# Patient Record
Sex: Female | Born: 2005
Health system: Southern US, Community
[De-identification: ages and names within clinical notes are randomized; demographics above are authoritative.]

## PROBLEM LIST (undated history)

## (undated) ENCOUNTER — Ambulatory Visit: Admission: EM | Payer: BC Managed Care – PPO | Source: Home / Self Care

## (undated) DIAGNOSIS — G43909 Migraine, unspecified, not intractable, without status migrainosus: Secondary | ICD-10-CM

## (undated) DIAGNOSIS — R51 Headache: Secondary | ICD-10-CM

## (undated) HISTORY — DX: Headache: R51

## (undated) HISTORY — DX: Migraine, unspecified, not intractable, without status migrainosus: G43.909

---
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
## 2023-07-04 ENCOUNTER — Ambulatory Visit
Admission: EM | Admit: 2023-07-04 | Discharge: 2023-07-04 | Disposition: A | Discharge status: Home / Self Care | Payer: Managed Care, Other (non HMO) | Attending: Internal Medicine | Admitting: Internal Medicine

## 2023-07-04 ENCOUNTER — Other Ambulatory Visit: Payer: Self-pay

## 2023-07-04 ENCOUNTER — Encounter: Payer: Self-pay | Admitting: *Deleted

## 2023-07-04 DIAGNOSIS — J069 Acute upper respiratory infection, unspecified: Secondary | ICD-10-CM | POA: Diagnosis present

## 2023-07-04 DIAGNOSIS — U071 COVID-19: Secondary | ICD-10-CM | POA: Insufficient documentation

## 2023-07-04 DIAGNOSIS — J029 Acute pharyngitis, unspecified: Secondary | ICD-10-CM | POA: Diagnosis present

## 2023-07-04 LAB — POCT RAPID STREP A (OFFICE): Rapid Strep A Screen: NEGATIVE

## 2023-07-04 MED ORDER — PROMETHAZINE-DM 6.25-15 MG/5ML PO SYRP: 5.0000 mL | ORAL_SOLUTION | Freq: Four times a day (QID) | ORAL | 0 refills | Status: DC | PRN

## 2023-07-04 MED ORDER — FLUTICASONE PROPIONATE 50 MCG/ACT NA SUSP: 1.0000 | Freq: Every day | NASAL | 0 refills | Status: DC

## 2023-07-04 NOTE — ED Provider Notes (Addendum)
[name]    CSN: [id number] Arrival date & time: 07/04/23  1843      History   Chief Complaint Chief Complaint  Patient presents with   Sore Throat   Facial Pain    HPI Erin Gates is a 17 y.o. female.   Patient presents with 4-day history of sore throat, nasal congestion, cough.  Reports Tmax of 100 at home.  Denies any known sick contacts.  Denies history of asthma.  Patient took COVID test at home that was negative.  She has taken the counter medication with minimal improvement in symptoms.   Sore Throat    Past Medical History:  Diagnosis Date   Headache(784.0)     Patient Active Problem List   Diagnosis Date Noted   Migraine without aura and without status migrainosus, not intractable 09/05/2013   Tension headache 09/05/2013    History reviewed. No pertinent surgical history.  OB History   No obstetric history on file.      Home Medications    Prior to Admission medications   Medication Sig Start Date End Date Taking? Authorizing Provider  fluticasone (FLONASE) 50 MCG/ACT nasal spray Place 1 spray into both nostrils daily. 07/04/23  Yes Veora Fonte, Acie Fredrickson, FNP  promethazine-dextromethorphan (PROMETHAZINE-DM) 6.25-15 MG/5ML syrup Take 5 mLs by mouth every 6 (six) hours as needed for cough. 07/04/23  Yes Aujanae Mccullum, Acie Fredrickson, FNP  albuterol (VENTOLIN HFA) 108 (90 Base) MCG/ACT inhaler SMARTSIG:By Mouth 12/03/22   [provider]  azelastine (ASTELIN) 0.1 % nasal spray Place into both nostrils. 12/03/22   [provider]  azithromycin (ZITHROMAX Z-PAK) 250 MG tablet 2 tablets initially and then 1 tablet daily until completed. 12/07/22   Ellsworth Lennox, PA-C  ibuprofen (ADVIL,MOTRIN) 400 MG tablet Take 1 tablet (400 mg total) by mouth every 6 (six) hours as needed. 12/30/18   Eustace Moore, MD  Magnesium Oxide (GNP MAGNESIUM OXIDE) 250 MG TABS Take by mouth.    [provider]  Melatonin 3 MG TABS Take 3 mg by mouth at bedtime.     [provider]  oseltamivir (TAMIFLU) 75 MG capsule Take 75 mg by mouth every 12 (twelve) hours. 11/29/22   [provider]  predniSONE (DELTASONE) 20 MG tablet Take 60 mg by mouth every morning. 12/03/22   [provider]  riboflavin (VITAMIN B-2) 100 MG TABS tablet Take 100 mg by mouth daily.    [provider]  triamcinolone (NASACORT) 55 MCG/ACT AERO nasal inhaler Place 2 sprays into the nose daily. 12/30/18   Eustace Moore, MD    Family History Family History  Problem Relation Age of Onset   Migraines Mother    Hypertension Mother    Diabetes Mother    Asperger's syndrome Sister    Autism Sister    ADD / ADHD Sister    Migraines Sister    Diabetes Father    Migraines Other        MGGM    Social History Social History   Tobacco Use   Smoking status: Never   Smokeless tobacco: Never  Vaping Use   Vaping status: Never Used  Substance Use Topics   Alcohol use: Never   Drug use: Never     Allergies   Patient has no known allergies.   Review of Systems Review of Systems Per HPI  Physical Exam Triage Vital Signs ED Triage Vitals  Encounter Vitals Group     BP 07/04/23 1909 117/81  Systolic BP Percentile --      Diastolic BP Percentile --      Pulse Rate 07/04/23 1909 (!) 103     Resp 07/04/23 1909 16     Temp 07/04/23 1909 98.7 F (37.1 C)     Temp Source 07/04/23 1909 Oral     SpO2 07/04/23 1909 97 %     Weight --      Height --      Head Circumference --      Peak Flow --      Pain Score 07/04/23 1904 4     Pain Loc --      Pain Education --      Exclude from Growth Chart --    No data found.  Updated Vital Signs BP 117/81 (BP Location: Right Wrist)   Pulse (!) 103   Temp 98.7 F (37.1 C) (Oral)   Resp 16   LMP 06/20/2023 (Approximate)   SpO2 97%   Visual Acuity Right Eye Distance:   Left Eye Distance:   Bilateral Distance:    Right Eye Near:   Left Eye Near:    Bilateral Near:      Physical Exam Constitutional:      General: She is not in acute distress.    Appearance: Normal appearance. She is not toxic-appearing or diaphoretic.  HENT:     Head: Normocephalic and atraumatic.     Right Ear: Tympanic membrane and ear canal normal.     Left Ear: Tympanic membrane and ear canal normal.     Nose: Congestion present.     Mouth/Throat:     Mouth: Mucous membranes are moist.     Pharynx: Posterior oropharyngeal erythema present.  Eyes:     Extraocular Movements: Extraocular movements intact.     Conjunctiva/sclera: Conjunctivae normal.     Pupils: Pupils are equal, round, and reactive to light.  Cardiovascular:     Rate and Rhythm: Normal rate and regular rhythm.     Pulses: Normal pulses.     Heart sounds: Normal heart sounds.  Pulmonary:     Effort: Pulmonary effort is normal. No respiratory distress.     Breath sounds: Normal breath sounds. No stridor. No wheezing, rhonchi or rales.  Abdominal:     General: Abdomen is flat. Bowel sounds are normal.     Palpations: Abdomen is soft.  Musculoskeletal:        General: Normal range of motion.     Cervical back: Normal range of motion.  Skin:    General: Skin is warm and dry.  Neurological:     General: No focal deficit present.     Mental Status: She is alert and oriented to person, place, and time. Mental status is at baseline.  Psychiatric:        Mood and Affect: Mood normal.        Behavior: Behavior normal.      UC Treatments / Results  Labs (all labs ordered are listed, but only abnormal results are displayed) Labs Reviewed  CULTURE, GROUP A STREP ([facility])  SARS CORONAVIRUS 2 (TAT 6-24 HRS)  POCT RAPID STREP A (OFFICE)    EKG   Radiology No results found.  Procedures Procedures (including critical Gates time)  Medications Ordered in UC Medications - No data to display  Initial Impression / Assessment and Plan / UC Course  I have reviewed the triage vital signs and the nursing  notes.  Pertinent labs & imaging results that  were available during my Gates of the patient were reviewed by me and considered in my medical decision making (see chart for details).     Patient presents with symptoms likely from a viral upper respiratory infection.  Do not suspect underlying cardiopulmonary process. Symptoms seem unlikely related to ACS, CHF or COPD exacerbations, pneumonia, pneumothorax. Patient is nontoxic appearing and not in need of emergent medical intervention.  Rapid strep negative.  Throat culture and COVID test pending.  Recommended symptom control with medications and supportive Gates.  Patient sent Promethazine DM and advised that this can make her drowsy.  Also sent Flonase. She denies that she takes any daily medications so this should be safe.   Return if symptoms fail to improve in 1-2 weeks or you develop shortness of breath, chest pain, severe headache. Patient states understanding and is agreeable.  Discharged with PCP followup.  Final Clinical Impressions(s) / UC Diagnoses   Final diagnoses:  Viral upper respiratory tract infection with cough  Sore throat     Discharge Instructions      Rapid strep is negative.  Throat culture and COVID test pending.  Will call if it is positive.  As we discussed, your symptoms appear viral in nature and should run its course.  I have prescribed you 2 medications to help alleviate symptoms.  Please be advised that cough medication can make you drowsy.  Follow-up if any symptoms persist or worsen.    ED Prescriptions     Medication Sig Dispense Auth. Provider   promethazine-dextromethorphan (PROMETHAZINE-DM) 6.25-15 MG/5ML syrup Take 5 mLs by mouth every 6 (six) hours as needed for cough. 118 mL Scarlet Abad, Rolly Salter E, FNP   fluticasone [facility]) 50 MCG/ACT nasal spray Place 1 spray into both nostrils daily. 16 g Gustavus Bryant, [place]      PDMP not reviewed this encounter.   Gustavus Bryant, [place] 07/04/23 1951    Gustavus Bryant, [place] 07/04/23 [id number]

## 2023-07-04 NOTE — ED Triage Notes (Signed)
Pt reports sore throat, congestion, cough, fever (on Monday). Took home covid test earlier this week and it was negative

## 2023-07-04 NOTE — Discharge Instructions (Signed)
Rapid strep is negative.  Throat culture and COVID test pending.  Will call if it is positive.  As we discussed, your symptoms appear viral in nature and should run its course.  I have prescribed you 2 medications to help alleviate symptoms.  Please be advised that cough medication can make you drowsy.  Follow-up if any symptoms persist or worsen.

## 2023-07-05 LAB — SARS CORONAVIRUS 2 (TAT 6-24 HRS): SARS Coronavirus 2: POSITIVE — AB

## 2023-07-07 LAB — CULTURE, GROUP A STREP (THRC)

## 2024-01-02 ENCOUNTER — Other Ambulatory Visit: Payer: Self-pay

## 2024-01-02 ENCOUNTER — Ambulatory Visit: Admission: EM | Admit: 2024-01-02 | Discharge: 2024-01-02 | Disposition: A | Discharge status: Home / Self Care

## 2024-01-02 DIAGNOSIS — G43911 Migraine, unspecified, intractable, with status migrainosus: Secondary | ICD-10-CM | POA: Diagnosis not present

## 2024-01-02 MED ORDER — ONDANSETRON HCL 4 MG/2ML IJ SOLN
4.0000 mg | Freq: Once | INTRAMUSCULAR | Status: AC
  Administered 2024-01-02: 4 mg via INTRAMUSCULAR

## 2024-01-02 MED ORDER — ONDANSETRON 4 MG PO TBDP: 4.0000 mg | ORAL_TABLET | Freq: Three times a day (TID) | ORAL | 0 refills | Status: DC | PRN

## 2024-01-02 MED ORDER — KETOROLAC TROMETHAMINE 30 MG/ML IJ SOLN
30.0000 mg | Freq: Once | INTRAMUSCULAR | Status: AC
  Administered 2024-01-02: 30 mg via INTRAMUSCULAR

## 2024-01-02 NOTE — ED Triage Notes (Signed)
 Pt presents with complaints of migraine x 5 to 6 days. Pt states she has taken Tylenol for the pain with no relief. Pt currently rates her overall pain a 5/10. Pt states the pain comes and goes.

## 2024-01-02 NOTE — Discharge Instructions (Addendum)
 You were seen today for concerns for an ongoing headache and is likely secondary to your previously diagnosed migraine disorder.  We have provided you with a Toradol 30 mg injection as well as a Zofran 4 mg injection to assist with your symptoms.  Toradol is an NSAID and its effects typically last for about a week.  I recommend refraining from using an over-the-counter NSAID for the next 48 hours.  You can use Tylenol as needed for breakthrough pain.  After that you can return to using your NSAID of choice per manufacturer's instructions.  I am also going to send in a prescription for Zofran for you to use at home to assist with nausea. Please make sure that you follow-up with your primary care provider or establish with 1 for ongoing migraine management.  Typically being on a preventative medication for recurrent migraine symptoms is very beneficial for most patients. If at any point you start to develop drooping of one side of the face, weakness on one side of the body, confusion, severe headache of your life, thunderclap headache, difficulty turning the head or moving the neck please go to the emergency room immediately as these could be signs of a severe or serious medical emergency.

## 2024-01-02 NOTE — ED Notes (Signed)
 Primary care appointment scheduled for patient by this RN:   Tuesday February 12, 2024 @ 10 AM   Pt verbalizes understanding.

## 2024-01-02 NOTE — ED Provider Notes (Signed)
 [facility]    CSN: [id number] Arrival date & time: 01/02/24  1429      History   Chief Complaint Chief Complaint  Patient presents with   Migraine    HPI Erin Gates is a 18 y.o. female.   HPI   Patient presents today with concerns for migraine that has been intermittent for the past 5-6 days  She states her pan is currently 5/10 and is not relieved with Tylenol at home  She reports her headache feels like one of her typical migraine headaches but is not improving with OTC medications She states pain is fluctuating from mild to moderate (sometimes she states she can't stand up due to pain)   Interventions: Tylenol extra strength - 2 pills  She denies recent head trauma or accidents, falls  She has a previous hx of migraines for which she was seen by pediatric Neurology about 10 years ago- per chart review.   Past Medical History:  Diagnosis Date   Headache(784.0)     Patient Active Problem List   Diagnosis Date Noted   Migraine without aura and without status migrainosus, not intractable 09/05/2013   Tension headache 09/05/2013    History reviewed. No pertinent surgical history.  OB History   No obstetric history on file.      Home Medications    Prior to Admission medications   Medication Sig Start Date End Date Taking? Authorizing Provider  busPIRone (BUSPAR) 10 MG tablet Take 10 mg by mouth 2 (two) times daily. 12/19/23  Yes [provider]  FLUoxetine (PROZAC) 20 MG capsule Take 20 mg by mouth daily. 12/19/23  Yes [provider]  ondansetron (ZOFRAN-ODT) 4 MG disintegrating tablet Take 1 tablet (4 mg total) by mouth every 8 (eight) hours as needed for nausea or vomiting. 01/02/24  Yes Natacha Jepsen E, PA-C  albuterol (VENTOLIN HFA) 108 (90 Base) MCG/ACT inhaler SMARTSIG:By Mouth 12/03/22   [provider]  azelastine (ASTELIN) 0.1 % nasal spray Place into both nostrils. 12/03/22   [provider]   azithromycin (ZITHROMAX Z-PAK) 250 MG tablet 2 tablets initially and then 1 tablet daily until completed. 12/07/22   Ellsworth Lennox, PA-C  fluticasone [facility]) 50 MCG/ACT nasal spray Place 1 spray into both nostrils daily. 07/04/23   Gustavus Bryant, FNP  ibuprofen (ADVIL,MOTRIN) 400 MG tablet Take 1 tablet (400 mg total) by mouth every 6 (six) hours as needed. 12/30/18   Eustace Moore, MD  Magnesium Oxide (GNP MAGNESIUM OXIDE) 250 MG TABS Take by mouth.    [provider]  Melatonin 3 MG TABS Take 3 mg by mouth at bedtime.    [provider]  oseltamivir (TAMIFLU) 75 MG capsule Take 75 mg by mouth every 12 (twelve) hours. 11/29/22   [provider]  predniSONE (DELTASONE) 20 MG tablet Take 60 mg by mouth every morning. 12/03/22   [provider]  promethazine-dextromethorphan (PROMETHAZINE-DM) 6.25-15 MG/5ML syrup Take 5 mLs by mouth every 6 (six) hours as needed for cough. 07/04/23   Gustavus Bryant, FNP  riboflavin (VITAMIN B-2) 100 MG TABS tablet Take 100 mg by mouth daily.    [provider]  triamcinolone (NASACORT) 55 MCG/ACT AERO nasal inhaler Place 2 sprays into the nose daily. 12/30/18   Eustace Moore, MD    Family History Family History  Problem Relation Age of Onset   Migraines Mother    Hypertension Mother    Diabetes Mother    Asperger's  syndrome Sister    Autism Sister    ADD / ADHD Sister    Migraines Sister    Diabetes Father    Migraines Other        MGGM    Social History Social History   Tobacco Use   Smoking status: Never   Smokeless tobacco: Never  Vaping Use   Vaping status: Never Used  Substance Use Topics   Alcohol use: Never   Drug use: Never     Allergies   Patient has no known allergies.   Review of Systems Review of Systems  Constitutional:  Negative for chills and fever.  Eyes:  Positive for photophobia. Negative for pain and visual disturbance.  Cardiovascular:  Negative for palpitations.   Gastrointestinal:  Positive for nausea (with position changes). Negative for vomiting.  Musculoskeletal:  Positive for neck pain. Negative for neck stiffness.  Neurological:  Positive for headaches. Negative for dizziness, syncope, facial asymmetry, speech difficulty, weakness, light-headedness and numbness.  Psychiatric/Behavioral:  Negative for agitation, confusion, hallucinations and sleep disturbance.      Physical Exam Triage Vital Signs ED Triage Vitals [01/02/24 1436]  Encounter Vitals Group     BP      Systolic BP Percentile      Diastolic BP Percentile      Pulse      Resp      Temp      Temp src      SpO2      Weight 260 lb (117.9 kg)     Height 5' 9.5" (1.765 m)     Head Circumference      Peak Flow      Pain Score 5     Pain Loc      Pain Education      Exclude from Growth Chart    No data found.  Updated Vital Signs BP 109/60 (BP Location: Right Arm)   Pulse 77   Temp 98.4 F (36.9 C) (Oral)   Resp 18   Ht 5' 9.5" (1.765 m)   Wt 260 lb (117.9 kg)   LMP 12/08/2023 (Exact Date)   SpO2 97%   BMI 37.84 kg/m   Visual Acuity Right Eye Distance:   Left Eye Distance:   Bilateral Distance:    Right Eye Near:   Left Eye Near:    Bilateral Near:     Physical Exam Vitals reviewed.  Constitutional:      General: She is awake.     Appearance: Normal appearance. She is well-developed and well-groomed.  HENT:     Head: Normocephalic and atraumatic.  Eyes:     General: Lids are normal. Gaze aligned appropriately.     Extraocular Movements: Extraocular movements intact.     Right eye: Normal extraocular motion and no nystagmus.     Left eye: Normal extraocular motion and no nystagmus.     Conjunctiva/sclera: Conjunctivae normal.     Pupils: Pupils are equal, round, and reactive to light.  Pulmonary:     Effort: Pulmonary effort is normal.  Musculoskeletal:     Cervical back: Normal range of motion and neck supple. No pain with movement or muscular  tenderness.  Neurological:     General: No focal deficit present.     Mental Status: She is alert and oriented to person, place, and time.     GCS: GCS eye subscore is 4. GCS verbal subscore is 5. GCS motor subscore is 6.     Cranial Nerves:  No cranial nerve deficit, dysarthria or facial asymmetry.     Motor: No weakness, tremor, atrophy or abnormal muscle tone.     Coordination: Heel to Shin Test normal.     Gait: Gait is intact.  Psychiatric:        Attention and Perception: Attention and perception normal.        Mood and Affect: Mood and affect normal.        Speech: Speech normal.        Behavior: Behavior normal. Behavior is cooperative.      UC Treatments / Results  Labs (all labs ordered are listed, but only abnormal results are displayed) Labs Reviewed - No data to display  EKG   Radiology No results found.  Procedures Procedures (including critical care time)  Medications Ordered in UC Medications  ondansetron (ZOFRAN) injection 4 mg (has no administration in time range)  ketorolac (TORADOL) 30 MG/ML injection 30 mg (has no administration in time range)    Initial Impression / Assessment and Plan / UC Course  I have reviewed the triage vital signs and the nursing notes.  Pertinent labs & imaging results that were available during my care of the patient were reviewed by me and considered in my medical decision making (see chart for details).      Final Clinical Impressions(s) / UC Diagnoses   Final diagnoses:  Intractable migraine with status migrainosus, unspecified migraine type     Patient presents today with concerns for ongoing migraine headache that is not resolving with over-the-counter medications at home.  She reports that she has a previous history of migraines which is confirmed with chart review.  Physical exam is overall reassuring.  Overall normal neurological exam, no weakness or deficits noted.  Vitals are also reassuring.  Patient reports  that she has tried to use extra strength Tylenol  without relief.  Will provide Toradol injection as well as Zofran injection here in clinic today.  Patient reports that she has previously had Toradol injection and denies side effects or reactions. Recommend refraining from NSAID use for the next 48 hours and then after that can use over-the-counter NSAIDs per preference.  Reviewed that she can continue to take Tylenol as needed for breakthrough pain.  Will also send in Zofran to assist with nausea at home.  Recommend close follow-up with primary care provider for ongoing management.  ED and return cautions reviewed and provided in after visit summary.  Follow-up as needed for progressing or persistent symptoms    Discharge Instructions      You were seen today for concerns for an ongoing headache and is likely secondary to your previously diagnosed migraine disorder.  We have provided you with a Toradol 30 mg injection as well as a Zofran 4 mg injection to assist with your symptoms.  Toradol is an NSAID and its effects typically last for about a week.  I recommend refraining from using an over-the-counter NSAID for the next 48 hours.  You can use Tylenol as needed for breakthrough pain.  After that you can return to using your NSAID of choice per manufacturer's instructions.  I am also going to send in a prescription for Zofran for you to use at home to assist with nausea. Please make sure that you follow-up with your primary care provider or establish with 1 for ongoing migraine management.  Typically being on a preventative medication for recurrent migraine symptoms is very beneficial for most patients. If at any point you  start to develop drooping of one side of the face, weakness on one side of the body, confusion, severe headache of your life, thunderclap headache, difficulty turning the head or moving the neck please go to the emergency room immediately as these could be signs of a severe or serious  medical emergency.     ED Prescriptions     Medication Sig Dispense Auth. Provider   ondansetron (ZOFRAN-ODT) 4 MG disintegrating tablet Take 1 tablet (4 mg total) by mouth every 8 (eight) hours as needed for nausea or vomiting. 20 tablet Adia Crammer E, PA-C      PDMP not reviewed this encounter.   Analise Glotfelty, Oswaldo Conroy, PA-C 01/02/24 1459

## 2024-02-12 ENCOUNTER — Ambulatory Visit: Admitting: Internal Medicine

## 2024-03-26 ENCOUNTER — Ambulatory Visit: Admitting: Internal Medicine

## 2024-03-26 VITALS — BP 122/72 | HR 76 | Temp 97.8°F | Ht 69.5 in | Wt 286.4 lb

## 2024-03-26 DIAGNOSIS — R5383 Other fatigue: Secondary | ICD-10-CM

## 2024-03-26 DIAGNOSIS — F419 Anxiety disorder, unspecified: Secondary | ICD-10-CM

## 2024-03-26 DIAGNOSIS — G43711 Chronic migraine without aura, intractable, with status migrainosus: Secondary | ICD-10-CM

## 2024-03-26 DIAGNOSIS — Z130 Encounter for screening for diseases of the blood and blood-forming organs and certain disorders involving the immune mechanism: Secondary | ICD-10-CM

## 2024-03-26 DIAGNOSIS — F32A Depression, unspecified: Secondary | ICD-10-CM | POA: Insufficient documentation

## 2024-03-26 LAB — BASIC METABOLIC PANEL WITH GFR
BUN: 17 mg/dL (ref 6–23)
CO2: 27 meq/L (ref 19–32)
Calcium: 9.1 mg/dL (ref 8.4–10.5)
Chloride: 103 meq/L (ref 96–112)
Creatinine, Ser: 0.91 mg/dL (ref 0.40–1.20)
GFR: 91.32 mL/min (ref >60.00)
Glucose, Bld: 89 mg/dL (ref 70–99)
Potassium: 4.2 meq/L (ref 3.5–5.1)
Sodium: 138 meq/L (ref 135–145)

## 2024-03-26 LAB — CBC WITH DIFFERENTIAL/PLATELET
Basophils Absolute: 0 10*3/uL (ref 0.0–0.1)
Basophils Relative: 0.5 % (ref 0.0–3.0)
Eosinophils Absolute: 0.1 10*3/uL (ref 0.0–0.7)
Eosinophils Relative: 1.1 % (ref 0.0–5.0)
HCT: 38.4 % (ref 36.0–49.0)
Hemoglobin: 12.6 g/dL (ref 12.0–16.0)
Lymphocytes Relative: 28 % (ref 24.0–48.0)
Lymphs Abs: 2 10*3/uL (ref 0.7–4.0)
MCHC: 32.7 g/dL (ref 31.0–37.0)
MCV: 79.8 fl (ref 78.0–98.0)
Monocytes Absolute: 0.5 10*3/uL (ref 0.1–1.0)
Monocytes Relative: 6.6 % (ref 3.0–12.0)
Neutro Abs: 4.6 10*3/uL (ref 1.4–7.7)
Neutrophils Relative %: 63.8 % (ref 43.0–71.0)
Platelets: 221 10*3/uL (ref 150.0–575.0)
RBC: 4.82 Mil/uL (ref 3.80–5.70)
RDW: 13.7 % (ref 11.4–15.5)
WBC: 7.2 10*3/uL (ref 4.5–13.5)

## 2024-03-26 LAB — IBC + FERRITIN
Ferritin: 10.5 ng/mL (ref 10.0–291.0)
Iron: 46 ug/dL (ref 42–145)
Saturation Ratios: 11.3 % — ABNORMAL LOW (ref 20.0–50.0)
TIBC: 407.4 ug/dL (ref 250.0–450.0)
Transferrin: 291 mg/dL (ref 212.0–360.0)

## 2024-03-26 LAB — VITAMIN D 25 HYDROXY (VIT D DEFICIENCY, FRACTURES): VITD: 20.04 ng/mL — ABNORMAL LOW (ref 30.00–100.00)

## 2024-03-26 LAB — TSH: TSH: 5.68 u[IU]/mL — ABNORMAL HIGH (ref 0.40–5.00)

## 2024-03-26 MED ORDER — SUMATRIPTAN SUCCINATE 50 MG PO TABS: ORAL_TABLET | ORAL | 1 refills | Status: DC

## 2024-03-26 MED ORDER — TOPIRAMATE 25 MG PO TABS: ORAL_TABLET | ORAL | 0 refills | Status: DC

## 2024-03-26 NOTE — Progress Notes (Signed)
 [facility] [address] [place] [address] Dept: [phone] Dept Fax: [phone]  New Patient Office Visit  Subjective:   Erin Gates 2006/09/26 03/26/2024  Chief Complaint  Patient presents with   Establish Care   Migraine    HPI: Erin Gates presents today to establish care at [employer] [facility]. Introduced to Publishing rights manager role and practice setting.  All questions answered.  Concerns: See below    Discussed the use of AI scribe software for clinical note transcription with the patient, who gave verbal consent to proceed.  History of Present Illness   Erin Gates is a 18 year old female with a history of migraines who presents to establish care and discuss her migraine headaches.  She has experienced migraines since third grade, initially diagnosed as stress-induced by a neurologist who prescribed Topamax . The migraines subsided for a period but resumed around age 70, with headaches occurring almost daily. The headaches are typically tension-focused, located in the back of her neck or sometimes affecting the whole head or right side. She experiences sensitivity to light and nausea during migraines. She has not been taking any specific medication for her headaches recently, only Tylenol, which rarely alleviates the pain. Currently she is experiencing daily headaches. More than 15 migraines per month.   She has a history of depression and anxiety, currently managed with Prozac (fluoxetine) 20 mg once daily and Buspar 10 mg twice daily, which she reports as effective. No other medications have been trialed for her anxiety or migraines aside from Topamax .  Strong scents often trigger her headaches, which tend to persist rather than resolve completely.   Her mother suspects she may have low iron and vitamin D levels, as anemia runs in the family, and she reports feeling tired  frequently.  She is preparing to attend college at [employer] in the fall.        The following portions of the patient's history were reviewed and updated as appropriate: past medical history, past surgical history, family history, social history, allergies, medications, and problem list.   Patient Active Problem List   Diagnosis Date Noted   Migraine without aura and without status migrainosus, not intractable 09/05/2013   Tension headache 09/05/2013   Past Medical History:  Diagnosis Date   Headache(784.0)    Migraines    History reviewed. No pertinent surgical history. Family History  Problem Relation Age of Onset   Migraines Mother    Hypertension Mother    Diabetes Mother    Asperger's syndrome Sister    Autism Sister    ADD / ADHD Sister    Migraines Sister    Diabetes Father    Migraines Other        MGGM    Current Outpatient Medications:    albuterol (VENTOLIN HFA) 108 (90 Base) MCG/ACT inhaler, SMARTSIG:By Mouth, Disp: , Rfl:    busPIRone (BUSPAR) 10 MG tablet, Take 10 mg by mouth 2 (two) times daily., Disp: , Rfl:    FLUoxetine (PROZAC) 20 MG capsule, Take 20 mg by mouth daily., Disp: , Rfl:    SUMAtriptan (IMITREX) 50 MG tablet, Take 1 tablet by mouth at onset of migraine headache. May repeat dose in 2 hours if headache persists or recurs., Disp: 10 tablet, Rfl: 1   topiramate  (TOPAMAX ) 25 MG tablet, Take 1 tablet by mouth for 1 week. Then take 2 tablets by mouth daily, Disp: 90 tablet, Rfl: 0  No Known Allergies  ROS: A complete ROS was performed with pertinent positives/negatives noted in the HPI. The remainder of the ROS are negative.   Objective:   Today's Vitals   03/26/24 1016  BP: 122/72  Pulse: 76  Temp: 97.8 F (36.6 C)  TempSrc: Temporal  SpO2: 99%  Weight: 286 lb 6.4 oz (129.9 kg)  Height: 5' 9.5" (1.765 m)    GENERAL: Well-appearing, in NAD. Well nourished.  SKIN: Pink, warm and dry. No rash, lesion, ulceration, or  ecchymoses.  NECK: Trachea midline. Full ROM w/o pain or tenderness. No lymphadenopathy.  RESPIRATORY: Chest wall symmetrical. Respirations even and non-labored. Breath sounds clear to auscultation bilaterally.  CARDIAC: S1, S2 present, regular rate and rhythm. Peripheral pulses 2+ bilaterally.  EXTREMITIES: Without clubbing, cyanosis, or edema.  NEUROLOGIC: No motor or sensory deficits. Steady, even gait.  PSYCH/MENTAL STATUS: Alert, oriented x 3. Cooperative, appropriate mood and affect.   Health Maintenance Due  Topic Date Due   HIV Screening  Never done   Meningococcal B Vaccine (1 of 2 - Standard) Never done   Hepatitis C Screening  Never done    Assessment & Plan:  Assessment and Plan    Intractable Migraine without aura and with status migrainous Chronic migraines occurring almost daily. Topamax  previously effective, current acetaminophen provides minimal relief. Plan to restart Topamax  and use Imitrex for acute management. Effexor considered if current plan fails. - Prescribe Topamax  to reduce migraine frequency and severity. - Prescribe Imitrex for acute migraine management. - Schedule follow-up in six weeks to assess treatment efficacy  Depression and Anxiety Current regimen of fluoxetine and Buspar effective. No changes planned to avoid disruption before college transition. - Continue fluoxetine 20 mg daily. - Continue Buspar 10 mg twice daily.  Screening for Anemia  - Patient reports family history of IDA  - CBC, Iron profile   Fatigue  - CBC., BMP, Vitamin D, TSH      Orders Placed This Encounter  Procedures   CBC with Differential/Platelet   TSH   VITAMIN D 25 Hydroxy (Vit-D Deficiency, Fractures)   IBC + Ferritin   Basic Metabolic Panel (BMET)   Meds ordered this encounter  Medications   topiramate  (TOPAMAX ) 25 MG tablet    Sig: Take 1 tablet by mouth for 1 week. Then take 2 tablets by mouth daily    Dispense:  90 tablet    Refill:  0    Supervising  Provider:   THOMPSON, AARON B [[id number]]   SUMAtriptan (IMITREX) 50 MG tablet    Sig: Take 1 tablet by mouth at onset of migraine headache. May repeat dose in 2 hours if headache persists or recurs.    Dispense:  10 tablet    Refill:  1    Supervising Provider:   THOMPSON, AARON B [[id number]]    Return in about 6 weeks (around 05/07/2024) for migraine headaches .   Erin Kast, FNP

## 2024-03-28 ENCOUNTER — Ambulatory Visit: Payer: Self-pay | Admitting: Internal Medicine

## 2024-03-28 DIAGNOSIS — E559 Vitamin D deficiency, unspecified: Secondary | ICD-10-CM | POA: Insufficient documentation

## 2024-04-22 ENCOUNTER — Other Ambulatory Visit: Payer: Self-pay | Admitting: Internal Medicine

## 2024-04-22 DIAGNOSIS — G43711 Chronic migraine without aura, intractable, with status migrainosus: Secondary | ICD-10-CM

## 2024-05-07 ENCOUNTER — Ambulatory Visit: Payer: Self-pay | Admitting: Internal Medicine

## 2024-05-07 ENCOUNTER — Ambulatory Visit: Admitting: Internal Medicine

## 2024-05-07 VITALS — BP 114/80 | HR 65 | Temp 98.0°F | Ht 69.5 in | Wt 284.2 lb

## 2024-05-07 DIAGNOSIS — E559 Vitamin D deficiency, unspecified: Secondary | ICD-10-CM | POA: Diagnosis not present

## 2024-05-07 DIAGNOSIS — R7989 Other specified abnormal findings of blood chemistry: Secondary | ICD-10-CM

## 2024-05-07 DIAGNOSIS — G43711 Chronic migraine without aura, intractable, with status migrainosus: Secondary | ICD-10-CM | POA: Diagnosis not present

## 2024-05-07 LAB — TSH: TSH: 4.18 u[IU]/mL (ref 0.40–5.00)

## 2024-05-07 LAB — T4, FREE: Free T4: 0.98 ng/dL (ref 0.60–1.60)

## 2024-05-07 MED ORDER — TOPIRAMATE 50 MG PO TABS: 50.0000 mg | ORAL_TABLET | Freq: Every day | ORAL | 1 refills | Status: AC

## 2024-05-07 NOTE — Progress Notes (Signed)
 [facility] [address] [place] [address] Dept: [phone] Dept Fax: [phone]    Subjective:   BRITTNIE LEWEY 04-Dec-2005 05/07/2024  Chief Complaint  Patient presents with   Follow-up    Improvement with headaches     HPI: VERBIE BABIC presents today for re-assessment and management of chronic medical conditions.  Discussed the use of AI scribe software for clinical note transcription with the patient, who gave verbal consent to proceed.  History of Present Illness   Erin Gates is a 18 year old female with migraines who presents for follow-up on her headache management.  She has experienced a significant improvement in her migraine headaches since starting Topamax . She takes 50 mg once daily, which has reduced her headache frequency from daily to approximately three times in the past four weeks. She also uses Imitrex  as needed, which helps to dull the headaches, although it does not completely eliminate them immediately. She often finds relief after sleeping.  Her thyroid  stimulating hormone (TSH) level at prior visit was 5.68, and there is no previous thyroid  function test to compare it to, so she is scheduled for a recheck today.  Her vitamin D  level was noted to be low at last visit, and she is taking a 1000 IU supplement daily. She feels less fatigued since starting the supplement.      The following portions of the patient's history were reviewed and updated as appropriate: past medical history, past surgical history, family history, social history, allergies, medications, and problem list.   Patient Active Problem List   Diagnosis Date Noted   Intractable chronic migraine without aura and with status migrainosus 05/07/2024   Vitamin D  deficiency 03/28/2024   Anxiety and depression 03/26/2024   Tension headache 09/05/2013   Past Medical History:  Diagnosis Date   Headache(784.0)    Migraines     No past surgical history on file. Family History  Problem Relation Age of Onset   Migraines Mother    Hypertension Mother    Diabetes Mother    Asperger's syndrome Sister    Autism Sister    ADD / ADHD Sister    Migraines Sister    Diabetes Father    Migraines Other        MGGM    Current Outpatient Medications:    albuterol (VENTOLIN HFA) 108 (90 Base) MCG/ACT inhaler, SMARTSIG:By Mouth, Disp: , Rfl:    busPIRone (BUSPAR) 10 MG tablet, Take 10 mg by mouth 2 (two) times daily., Disp: , Rfl:    cholecalciferol (VITAMIN D3) 25 MCG (1000 UNIT) tablet, Take 1,000 Units by mouth daily., Disp: , Rfl:    FLUoxetine (PROZAC) 20 MG capsule, Take 20 mg by mouth daily., Disp: , Rfl:    SUMAtriptan  (IMITREX ) 50 MG tablet, Take 1 tablet by mouth at onset of migraine headache. May repeat dose in 2 hours if headache persists or recurs., Disp: 10 tablet, Rfl: 1   topiramate  (TOPAMAX ) 50 MG tablet, Take 1 tablet (50 mg total) by mouth daily., Disp: 90 tablet, Rfl: 1 No Known Allergies   ROS: A complete ROS was performed with pertinent positives/negatives noted in the HPI. The remainder of the ROS are negative.    Objective:   Today's Vitals   05/07/24 1017  BP: 114/80  Pulse: 65  Temp: 98 F (36.7 C)  TempSrc: Temporal  SpO2: 99%  Weight: 284 lb 3.2 oz (128.9 kg)  Height: 5' 9.5 (1.765 m)  PF: (!) 8 L/min    GENERAL: Well-appearing, in NAD. Well nourished.  SKIN: Pink, warm and dry. No rash, lesion, ulceration, or ecchymoses.  NECK: Trachea midline. Full ROM w/o pain or tenderness. No lymphadenopathy. No thyromegaly or palpable masses.  RESPIRATORY: Chest wall symmetrical. Respirations even and non-labored. Breath sounds clear to auscultation bilaterally.  CARDIAC: S1, S2 present, regular rate and rhythm. Peripheral pulses 2+ bilaterally.  EXTREMITIES: Without clubbing, cyanosis, or edema.  NEUROLOGIC: No motor or sensory deficits. Steady, even gait.  PSYCH/MENTAL STATUS: Alert,  oriented x 3. Cooperative, appropriate mood and affect.   Health Maintenance Due  Topic Date Due   HIV Screening  Never done   Meningococcal B Vaccine (1 of 2 - Standard) Never done   Hepatitis C Screening  Never done    No results found for any visits on 05/07/24.  The ASCVD Risk score (Arnett DK, et al., 2019) failed to calculate for the following reasons:   The 2019 ASCVD risk score is only valid for ages 21 to 22     Assessment & Plan:  Assessment and Plan    Migraine without aura Significant improvement with Topamax , reducing frequency from daily to three episodes in four weeks. Imitrex  provides partial relief. Consider alternative treatments if Imitrex  is inadequate. - Continue Topamax  50 mg once daily. - Continue Imitrex  as needed. - Advise second Imitrex  tablet two hours after first if headache persists. - Consider Nurtec or Ubrelvy if Imitrex  ineffective. - Follow up in two months or sooner if migraines persist.  Abnormal TSH level TSH slightly elevated at 5.68, indicating possible hypothyroidism. Recheck needed due to lack of previous levels. - Recheck TSH level today. - Obtain free T4 level today. - Consider further evaluation and treatment if TSH remains elevated.  Vitamin D  deficiency Low vitamin D  level with improvement in fatigue on 1000 IU supplement. - Continue vitamin D  1000 IU daily. - Recheck vitamin D  level at annual physical in three months.       Orders Placed This Encounter  Procedures   TSH   T4, free   No images are attached to the encounter or orders placed in the encounter. Meds ordered this encounter  Medications   topiramate  (TOPAMAX ) 50 MG tablet    Sig: Take 1 tablet (50 mg total) by mouth daily.    Dispense:  90 tablet    Refill:  1    Supervising Provider:   THOMPSON, AARON B [[id number]]    Return in about 3 months (around 08/07/2024) for Annual Physical Exam with fasting lab work.   Rosina Senters, FNP

## 2024-05-09 ENCOUNTER — Encounter: Payer: Self-pay | Admitting: Advanced Practice Midwife

## 2024-05-22 ENCOUNTER — Other Ambulatory Visit: Payer: Self-pay | Admitting: Internal Medicine

## 2024-05-22 DIAGNOSIS — G43711 Chronic migraine without aura, intractable, with status migrainosus: Secondary | ICD-10-CM

## 2024-08-07 ENCOUNTER — Encounter: Admitting: Internal Medicine
